# Patient Record
Sex: Male | Born: 1960 | Race: White | Hispanic: No | Marital: Single | Smoking: Current every day smoker
Health system: Southern US, Community
[De-identification: ages and names within clinical notes are randomized; demographics above are authoritative.]

## PROBLEM LIST (undated history)

## (undated) DIAGNOSIS — K219 Gastro-esophageal reflux disease without esophagitis: Secondary | ICD-10-CM

## (undated) DIAGNOSIS — T7840XA Allergy, unspecified, initial encounter: Secondary | ICD-10-CM

## (undated) HISTORY — DX: Gastro-esophageal reflux disease without esophagitis: K21.9

## (undated) HISTORY — PX: APPENDECTOMY: SHX54

## (undated) HISTORY — DX: Allergy, unspecified, initial encounter: T78.40XA

---

## 2013-12-04 ENCOUNTER — Encounter: Payer: Self-pay | Admitting: Family Medicine

## 2013-12-04 ENCOUNTER — Other Ambulatory Visit (INDEPENDENT_AMBULATORY_CARE_PROVIDER_SITE_OTHER): Payer: BC Managed Care – PPO

## 2013-12-04 ENCOUNTER — Ambulatory Visit (INDEPENDENT_AMBULATORY_CARE_PROVIDER_SITE_OTHER): Payer: BC Managed Care – PPO | Admitting: Family Medicine

## 2013-12-04 VITALS — BP 140/80 | HR 90 | Ht 71.0 in | Wt 138.0 lb

## 2013-12-04 DIAGNOSIS — R634 Abnormal weight loss: Secondary | ICD-10-CM

## 2013-12-04 DIAGNOSIS — M25512 Pain in left shoulder: Secondary | ICD-10-CM

## 2013-12-04 DIAGNOSIS — M25519 Pain in unspecified shoulder: Secondary | ICD-10-CM

## 2013-12-04 DIAGNOSIS — M7500 Adhesive capsulitis of unspecified shoulder: Secondary | ICD-10-CM | POA: Insufficient documentation

## 2013-12-04 MED ORDER — MELOXICAM 15 MG PO TABS: 15.0000 mg | ORAL_TABLET | Freq: Every day | ORAL | Status: AC

## 2013-12-04 NOTE — Progress Notes (Signed)
Travis ScaleZach Coe Angelos D.O. [facility] [address] [place], [place] [address] Phone: [phone]([phone] Subjective:     CC: Left shoulder pain  BJY:NWGNFAOZHYHPI:Subjective Travis Alvarez CornBruce R Alvarez is a 53 y.o. male coming in with complaint of left shoulder pain. Patient has had this pain for quite some time and states it seemed to start in the summer. Patient states he has been doing a lot activity and has a dull aching pain that seems to radiate down his arm after activity. Patient denies any weakness but he stopped certain activity secondary to pain. Patient states it is very hard to get comfortable at night. No injury.  Severity 8/10.       Past medical history, social, surgical and family history all reviewed in electronic medical record.   Review of Systems: No headache, visual changes, nausea, vomiting, diarrhea, constipation, dizziness, abdominal pain, skin rash, fevers, chills, night sweats, weight loss, swollen lymph nodes, body aches, joint swelling, muscle aches, chest pain, shortness of breath, mood changes.   Objective Blood pressure 140/80, pulse 90, weight 138 lb (62.596 kg), SpO2 96.00%.  General: No apparent distress alert and oriented x3 mood and affect normal, dressed appropriately. Frail.  HEENT: Pupils equal, extraocular movements intact  Respiratory: Patient's speak in full sentences and does not appear short of breath  Cardiovascular: No lower extremity edema, non tender, no erythema  Skin: Warm dry intact with no signs of infection or rash on extremities or on axial skeleton.  Abdomen: Soft nontender  Neuro: Cranial nerves II through XII are intact, neurovascularly intact in all extremities with 2+ DTRs and 2+ pulses.  Lymph: No lymphadenopathy of posterior or anterior cervical chain or axillae bilaterally.  Gait normal with good balance and coordination.  MSK:  Non tender with full range of motion and good stability and symmetric strength and tone of  elbows, wrist, hip, knee and ankles  bilaterally.  Shoulder:left  Inspection reveals no abnormalities, atrophy or asymmetry. Mild atrophy of the shoulder musculature bilaterally Palpation has generalized tenderness of the shoulder ROM is instructed in passive and active range of motion with forward flexion to 95, external rotation of 10 and internal rotation to lateral hip. Rotator cuff strength normal throughout. signs of impingement with positive Neer and Hawkin's tests, empty can sign. Speeds and Yergason's tests normal. No labral pathology noted with negative Obrien's, negative clunk and good stability. Normal scapular function observed. No painful arc and no drop arm sign. No apprehension sign Contralateral shoulder has significant more strength and movement  MSK US performed of: Left This study was ordered, performed, and interpreted by Terrilee FilesZach Myan Suit D.O.  Shoulder:   Supraspinatus:  Appears normal on long and transverse views, no bursal bulge seen with shoulder abduction on impingement view. A sugar does have thickening of the capsule surrounding the area. Infraspinatus:  Appears normal on long and transverse views. Subscapularis:  Appears normal on long and transverse views. Teres Minor:  Appears normal on long and transverse views. AC joint:  Moderate osteoarthritic changes Glenohumeral Joint:  Appears normal without effusion. Glenoid Labrum:  Intact without visualized tears. Biceps Tendon:  Appears normal on long and transverse views, no fraying of tendon, tendon located in intertubercular groove, no subluxation with shoulder internal or external rotation. No increased power doppler signal. Impression:  frozen shoulder  Procedure: Real-time Ultrasound Guided Injection of left glenohumeral joint Device: GE Logiq E  Ultrasound guided injection is preferred based studies that show increased duration, increased effect, greater accuracy, decreased procedural pain,  increased response rate with ultrasound guided versus  blind injection.  Verbal informed consent obtained.  Time-out conducted.  Noted no overlying erythema, induration, or other signs of local infection.  Skin prepped in a sterile fashion.  Local anesthesia: Topical Ethyl chloride.  With sterile technique and under real time ultrasound guidance:  Joint visualized.  23g 1  inch needle inserted posterior approach. Pictures taken for needle placement. Patient did have injection of 2 cc of 1% lidocaine, 2 cc of 0.5% Marcaine, and 1cc of Kenalog 40 mg/dL. Completed without difficulty  Pain immediately resolved suggesting accurate placement of the medication.  Advised to call if fevers/chills, erythema, induration, drainage, or persistent bleeding.  Images permanently stored and available for review in the ultrasound unit.  Impression: Technically successful ultrasound guided injection.    Impression and Recommendations:     This case required medical decision making of moderate complexity.

## 2013-12-04 NOTE — Assessment & Plan Note (Signed)
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 Patient states that he does not have any diabetes has lost weight recently. Patient is having a workup by primary care provider at this time for his weight loss. Patient did respond extremely well to the intra-articular injection today with some increasing range of motion. Patient had significant decrease in pain immediately. Patient given home exercises to do on a daily basis as well as an icing protocol and meloxicam daily for 10 days then as needed. Patient and will come back again in 3 weeks for further evaluation. He continues to have trouble we may want to consider formal physical therapy.

## 2013-12-04 NOTE — Patient Instructions (Addendum)
Nice to meet you Try exercises 3 times a week Meloxicam daily for 10 days then as needed Ice 20 minutes 2 times a day mostly after exercises and before bed.  Come back in 3 weeks if not better.

## 2013-12-04 NOTE — Assessment & Plan Note (Signed)
This patient been a smoker we discussed the possibility of chest x-ray the patient states that he is being worked up by primary care provider. We'll allow him to continue with the workup.

## 2013-12-27 ENCOUNTER — Ambulatory Visit (INDEPENDENT_AMBULATORY_CARE_PROVIDER_SITE_OTHER): Payer: BC Managed Care – PPO | Admitting: Family Medicine

## 2013-12-27 VITALS — BP 118/70 | HR 78 | Wt 135.0 lb

## 2013-12-27 DIAGNOSIS — M7500 Adhesive capsulitis of unspecified shoulder: Secondary | ICD-10-CM

## 2013-12-27 MED ORDER — DICLOFENAC SODIUM 1 % TD GEL: 2.0000 g | Freq: Two times a day (BID) | TRANSDERMAL | Status: AC

## 2013-12-27 NOTE — Patient Instructions (Addendum)
Very good to see you doing better.  Ice still 10 minutes 2 times a day.  voltaren gel twice daily.  Call me if you want to continue the meloxicam  Continue exercises most days of the week at least including new pain.  Come back in 3-4 weeks again to check and we can consider another reinjection.

## 2013-12-28 ENCOUNTER — Encounter: Payer: Self-pay | Admitting: Family Medicine

## 2013-12-28 NOTE — Progress Notes (Signed)
  Travis ScaleZach Linsy Alvarez D.O. [facility] [address] [place], [place] [address] Phone: [phone](336) [phone] Subjective:     CC: Left shoulder pain follow up  BJY:NWGNFAOZHYHPI:Subjective Travis CornBruce R Alvarez is a 53 y.o. male coming in with complaint of left shoulder pain. Patient was diagnosed with adhesive capsulitis. Patient did have injection notice mild to moderate benefit. Patient states that he continues to do the exercises on her in the basis and has noted some mild increase in range of motion but continues to have the same amount of pain. Patient though is trying to not take the pain medicines on a regular basis. Patient denies any new symptoms. Denies any waking him up at night.      Past medical history, social, surgical and family history all reviewed in electronic medical record.   Review of Systems: No headache, visual changes, nausea, vomiting, diarrhea, constipation, dizziness, abdominal pain, skin rash, fevers, chills, night sweats, weight loss, swollen lymph nodes, body aches, joint swelling, muscle aches, chest pain, shortness of breath, mood changes.   Objective Blood pressure 118/70, pulse 78, weight 135 lb (61.236 kg), SpO2 97.00%.  General: No apparent distress alert and oriented x3 mood and affect normal, dressed appropriately. Frail.  HEENT: Pupils equal, extraocular movements intact  Respiratory: Patient's speak in full sentences and does not appear short of breath  Cardiovascular: No lower extremity edema, non tender, no erythema  Skin: Warm dry intact with no signs of infection or rash on extremities or on axial skeleton.  Abdomen: Soft nontender  Neuro: Cranial nerves II through XII are intact, neurovascularly intact in all extremities with 2+ DTRs and 2+ pulses.  Lymph: No lymphadenopathy of posterior or anterior cervical chain or axillae bilaterally.  Gait normal with good balance and coordination.  MSK:  Non tender with full range of motion and good stability and symmetric strength  and tone of  elbows, wrist, hip, knee and ankles bilaterally.  Shoulder:left  Inspection reveals no abnormalities, atrophy or asymmetry. Mild atrophy of the shoulder musculature bilaterally Palpation has generalized tenderness of the shoulder ROM is decreased in passive and active range of motion with forward flexion to 135, external rotation of 15 and internal rotation to lateral hip. This is though significantly improved from prior exam Rotator cuff strength normal throughout. signs of impingement with positive Neer and Hawkin's tests, empty can sign. Speeds and Yergason's tests normal. No labral pathology noted with negative Obrien's, negative clunk and good stability. Normal scapular function observed. No painful arc and no drop arm sign. No apprehension sign Contralateral shoulder has significant more strength and movement      Impression and Recommendations:     This case required medical decision making of moderate complexity.  Spent greater than 25 minutes with patient face-to-face and had greater than 50% of counseling including as described above in assessment and plan.

## 2013-12-28 NOTE — Assessment & Plan Note (Signed)
Discuss again with patient at great length. We discussed different treatment options including another steroid injection or formal physical therapy. Patient declines both of these interventions. We also discussed continuing home exercises as well as medications. Patient will continue with the exercises and was given other ones to try to do and oriented basis. Patient to declined any other prescription medications at this time. Patient will take meloxicam on an as-needed basis he states. We discussed icing protocol. Patient will continue with this conservative treatment and come back again in 3 weeks for further evaluation. At followup we can consider another ultrasound an injection.

## 2014-01-24 ENCOUNTER — Encounter: Payer: Self-pay | Admitting: Family Medicine

## 2014-01-24 ENCOUNTER — Ambulatory Visit (INDEPENDENT_AMBULATORY_CARE_PROVIDER_SITE_OTHER)
Admission: RE | Admit: 2014-01-24 | Discharge: 2014-01-24 | Disposition: A | Discharge status: Home / Self Care | Payer: BC Managed Care – PPO | Source: Ambulatory Visit | Attending: Family Medicine | Admitting: Family Medicine

## 2014-01-24 ENCOUNTER — Ambulatory Visit (INDEPENDENT_AMBULATORY_CARE_PROVIDER_SITE_OTHER): Payer: BC Managed Care – PPO | Admitting: Family Medicine

## 2014-01-24 ENCOUNTER — Other Ambulatory Visit (INDEPENDENT_AMBULATORY_CARE_PROVIDER_SITE_OTHER): Payer: BC Managed Care – PPO

## 2014-01-24 VITALS — BP 122/64 | HR 87 | Ht 71.5 in | Wt 131.0 lb

## 2014-01-24 DIAGNOSIS — M7500 Adhesive capsulitis of unspecified shoulder: Secondary | ICD-10-CM

## 2014-01-24 DIAGNOSIS — R634 Abnormal weight loss: Secondary | ICD-10-CM

## 2014-01-24 NOTE — Progress Notes (Signed)
Travis ScaleZach Lucely Alvarez D.O. [facility] [address] [place], [place] [address] Phone: [phone](336) [phone] Subjective:     CC: Left shoulder pain follow up  BJY:NWGNFAOZHYHPI:Subjective Travis Alvarez is a 53 y.o. male coming in with complaint of left shoulder pain. Patient was diagnosed with adhesive capsulitis. Patient underwent injection greater than 2 months ago. Patient states since that time he continues to improve slowly. Patient states that the pain is much better but still has limited range of motion. Patient states it is a seconding that he does a regular basis. Patient would like to have full range of motion. Patient continues to have weight loss to that he is more concerned about. Patient does not think that they are associated. Patient is not having much of an appetite.      Past medical history, social, surgical and family history all reviewed in electronic medical record.   Review of Systems: No headache, visual changes, nausea, vomiting, diarrhea, constipation, dizziness, abdominal pain, skin rash, fevers, chills, night sweats, weight loss, swollen lymph nodes, body aches, joint swelling, muscle aches, chest pain, shortness of breath, mood changes.   Objective Blood pressure 122/64, pulse 87, height 5' 11.5" (1.816 m), weight 131 lb (59.421 kg), SpO2 95.00%.  General: No apparent distress alert and oriented x3 mood and affect normal, dressed appropriately. Frail.  HEENT: Pupils equal, extraocular movements intact  Respiratory: Patient's speak in full sentences and does not appear short of breath  Cardiovascular: No lower extremity edema, non tender, no erythema  Skin: Warm dry intact with no signs of infection or rash on extremities or on axial skeleton.  Abdomen: Soft nontender  Neuro: Cranial nerves II through XII are intact, neurovascularly intact in all extremities with 2+ DTRs and 2+ pulses.  Lymph: No lymphadenopathy of posterior or anterior cervical chain or axillae bilaterally.  Gait  normal with good balance and coordination.  MSK:  Non tender with full range of motion and good stability and symmetric strength and tone of  elbows, wrist, hip, knee and ankles bilaterally.  Shoulder:left  Inspection reveals no abnormalities, atrophy or asymmetry. Mild atrophy of the shoulder musculature bilaterally Palpation has generalized tenderness of the shoulder ROM is decreased in passive and active range of motion with forward flexion to 155, external rotation of 15 and internal rotation to the sacrum.  continued improvement from previous exam Rotator cuff strength normal throughout. signs of impingement with positive Neer and Hawkin's tests, empty can sign. Speeds and Yergason's tests normal. No labral pathology noted with negative Obrien's, negative clunk and good stability. Normal scapular function observed. No painful arc and no drop arm sign. No apprehension sign Contralateral shoulder has significant more strength and movement  Procedure: Real-time Ultrasound Guided Injection of left glenohumeral joint Device: GE Logiq E  Ultrasound guided injection is preferred based studies that show increased duration, increased effect, greater accuracy, decreased procedural pain, increased response rate with ultrasound guided versus blind injection.  Verbal informed consent obtained.  Time-out conducted.  Noted no overlying erythema, induration, or other signs of local infection.  Skin prepped in a sterile fashion.  Local anesthesia: Topical Ethyl chloride.  With sterile technique and under real time ultrasound guidance:  Joint visualized.  23g 1  inch needle inserted posterior approach. Pictures taken for needle placement. Patient did have injection of 2 cc of 1% lidocaine, 2 cc of 0.5% Marcaine, and 1cc of Kenalog 40 mg/dL. Completed without difficulty  Pain immediately resolved suggesting accurate placement of the medication.  Advised to call if fevers/chills, erythema, induration,  drainage, or persistent bleeding.  Images permanently stored and available for review in the ultrasound unit.  Impression: Technically successful ultrasound guided injection.     Impression and Recommendations:     This case required medical decision making of moderate complexity.  Spent greater than 25 minutes with patient face-to-face and had greater than 50% of counseling including as described above in assessment and plan.

## 2014-01-24 NOTE — Assessment & Plan Note (Signed)
Patient has lost 70 pounds over the course last 2 months. I do feel that an x-ray was necessary because he is a smoker. Patient's x-ray is unremarkable and reviewed by me today. Patient is going to be seen a new primary care provider soon. Patient likely needs a thorough workup.

## 2014-01-24 NOTE — Assessment & Plan Note (Signed)
Patient was given another injection today. I am hoping that this resolves mostly the adhesions and patient did continue with his range of motion. Patient will come back again in 4 weeks for further evaluation.  Spent greater than 25 minutes with patient face-to-face and had greater than 50% of counseling including as described above in assessment and plan.

## 2014-01-24 NOTE — Patient Instructions (Signed)
Capsaicin.  You can get this over the counter.  Put on the shoulder 3 times daily.  We did injection again today Keep doing the exercises you are doing great!!!! Get xray of chest today.  Boost or ensure 45 minutes before meal.  Come back in 4 weeks.  Dr. Drue NovelPaz may be able to see you and discuss your weight.

## 2014-02-21 ENCOUNTER — Encounter: Payer: Self-pay | Admitting: Family Medicine

## 2014-02-21 ENCOUNTER — Ambulatory Visit (INDEPENDENT_AMBULATORY_CARE_PROVIDER_SITE_OTHER): Payer: BC Managed Care – PPO | Admitting: Family Medicine

## 2014-02-21 VITALS — BP 110/62 | HR 94 | Ht 71.0 in | Wt 128.0 lb

## 2014-02-21 DIAGNOSIS — M7500 Adhesive capsulitis of unspecified shoulder: Secondary | ICD-10-CM

## 2014-02-21 DIAGNOSIS — F32A Depression, unspecified: Secondary | ICD-10-CM

## 2014-02-21 DIAGNOSIS — F329 Major depressive disorder, single episode, unspecified: Secondary | ICD-10-CM | POA: Insufficient documentation

## 2014-02-21 DIAGNOSIS — F3289 Other specified depressive episodes: Secondary | ICD-10-CM

## 2014-02-21 DIAGNOSIS — R634 Abnormal weight loss: Secondary | ICD-10-CM

## 2014-02-21 NOTE — Progress Notes (Signed)
  Tawana ScaleZach Taelar Gronewold D.O. [facility] [address] [place], [place] [address] Phone: [phone](336) [phone] Subjective:     CC: Left shoulder pain follow up  OZH:YQMVHQIONGHPI:Subjective Sunday CornBruce R Alvarez is a 53 y.o. male coming in with complaint of left shoulder pain. Patient was diagnosed with adhesive capsulitis. Patient over the course last 3 months has had 2 intra-articular injections into the shoulder. Patient continue to do home exercises on a regular basis. Patient states overall he is happier.  Patient also states that he continues to lose weight. Patient has been hungry but states that sometimes he has to vomit so he can eat more. Patient also has tangential thought discussing that he is also has people who are following him around from time to time but he has not seen him. Patient states though. Patient denies any suicidal or homicidal ideation and denies any voices in his head. Patient states though because he is anxious because of this as well as because he does not have much money he gets concern.       Past medical history, social, surgical and family history all reviewed in electronic medical record.   Review of Systems: No headache, visual changes, nausea, vomiting, diarrhea, constipation, dizziness, abdominal pain, skin rash, fevers, chills, night sweats, weight loss, swollen lymph nodes, body aches, joint swelling, muscle aches, chest pain, shortness of breath, mood changes.   Objective Blood pressure 110/62, pulse 94, height 5\' 11"  (1.803 m), weight 128 lb (58.06 kg), SpO2 94.00%.  General: Patient does appear to continue to lose weight. Patient is also having a flat affect. HEENT: Pupils equal, extraocular movements intact  Respiratory: Patient's speak in full sentences and does not appear short of breath  Cardiovascular: No lower extremity edema, non tender, no erythema  Skin: Warm dry intact with no signs of infection or rash on extremities or on axial skeleton.  Abdomen: Soft nontender    Neuro: Cranial nerves II through XII are intact, neurovascularly intact in all extremities with 2+ DTRs and 2+ pulses.  Lymph: No lymphadenopathy of posterior or anterior cervical chain or axillae bilaterally.  Gait normal with good balance and coordination.  MSK:  Non tender with full range of motion and good stability and symmetric strength and tone of  elbows, wrist, hip, knee and ankles bilaterally.  Shoulder:left  Inspection reveals no abnormalities, atrophy or asymmetry. Mild atrophy of the shoulder musculature bilaterally Palpation has generalized tenderness of the shoulder ROM is decreased in passive and active range of motion with forward flexion to 175, external rotation of 18 and internal rotation to the sacrum.  continued improvement from previous exam Rotator cuff strength normal throughout. signs of impingement with positive Neer and Hawkin's tests, empty can sign. Speeds and Yergason's tests normal. No labral pathology noted with negative Obrien's, negative clunk and good stability. Normal scapular function observed. No painful arc and no drop arm sign. No apprehension sign Contralateral shoulder has significant more strength and movement    Impression and Recommendations:     This case required medical decision making of moderate complexity.  Spent greater than 25 minutes with patient face-to-face and had greater than 50% of counseling including as described above in assessment and plan.

## 2014-02-21 NOTE — Assessment & Plan Note (Signed)
Patient shoulder has improved with range of motion and significant decrease in pain. Patient encouraged to continue the exercises on a regular basis. Patient has a near full range of motion. Patient encouraged to continue to do this and I will come back again in 6 weeks for hopefully one more followup.

## 2014-02-21 NOTE — Assessment & Plan Note (Signed)
Patient did have a CBG done today that showed a number of 101 We discussed about getting more labs today but patient states that he did establish care with a primary care provider outside of our computer system. Patient states though he is not happy with this individual. We discussed getting release of information in getting the labs. I would like to make sure that patient has had a CBC and a PSA done recently. We also or attempting to get patient establish with a new primary care provider Dr. Durene CalHunter in August.

## 2014-02-21 NOTE — Assessment & Plan Note (Addendum)
Patient does have what appears to be some depression. We did not do a screening tool at this time. Patient denies any suicidal or homicidal ideation. Patient is to have racing thoughts and states that sometimes he is feeling paranoid. Patient does not have a job and has some financial constraints which is difficult as well. We discussed referral to [facility] health for further evaluation which patient declined. Discuss with patient though at great length that if patient's thoughts of her change to hurting him or someone else that he needs to seek medical attention and medially and I am always available. Spent greater than 25 minutes with patient face-to-face and had greater than 50% of counseling including as described above in assessment and plan.

## 2014-02-21 NOTE — Patient Instructions (Signed)
It is good to see you Your shoulder is getting better Continue the exercises I really want you to get a new doctor.  Try to get the other information if you want me to look it over I am here if you need anything.  If your shoulder is not perfect come back in 6 weeks.

## 2014-04-11 ENCOUNTER — Other Ambulatory Visit: Payer: Self-pay | Admitting: Family Medicine

## 2014-04-11 DIAGNOSIS — R1310 Dysphagia, unspecified: Secondary | ICD-10-CM

## 2014-04-11 DIAGNOSIS — R634 Abnormal weight loss: Secondary | ICD-10-CM

## 2014-04-16 ENCOUNTER — Other Ambulatory Visit: Payer: BC Managed Care – PPO

## 2014-04-18 ENCOUNTER — Other Ambulatory Visit: Payer: Self-pay | Admitting: Family Medicine

## 2014-04-18 ENCOUNTER — Ambulatory Visit
Admission: RE | Admit: 2014-04-18 | Discharge: 2014-04-18 | Disposition: A | Discharge status: Home / Self Care | Payer: BC Managed Care – PPO | Source: Ambulatory Visit | Attending: Family Medicine | Admitting: Family Medicine

## 2014-04-18 DIAGNOSIS — R634 Abnormal weight loss: Secondary | ICD-10-CM

## 2014-04-18 DIAGNOSIS — R1310 Dysphagia, unspecified: Secondary | ICD-10-CM

## 2015-04-15 IMAGING — CR DG CHEST 2V
2 series · 2 of 2 positions shown · non-contrast
Comparison: Chest x-ray of 01/24/2014

CLINICAL DATA: Cough, smoking history

EXAM:
CHEST  2 VIEW

[w chest pa]
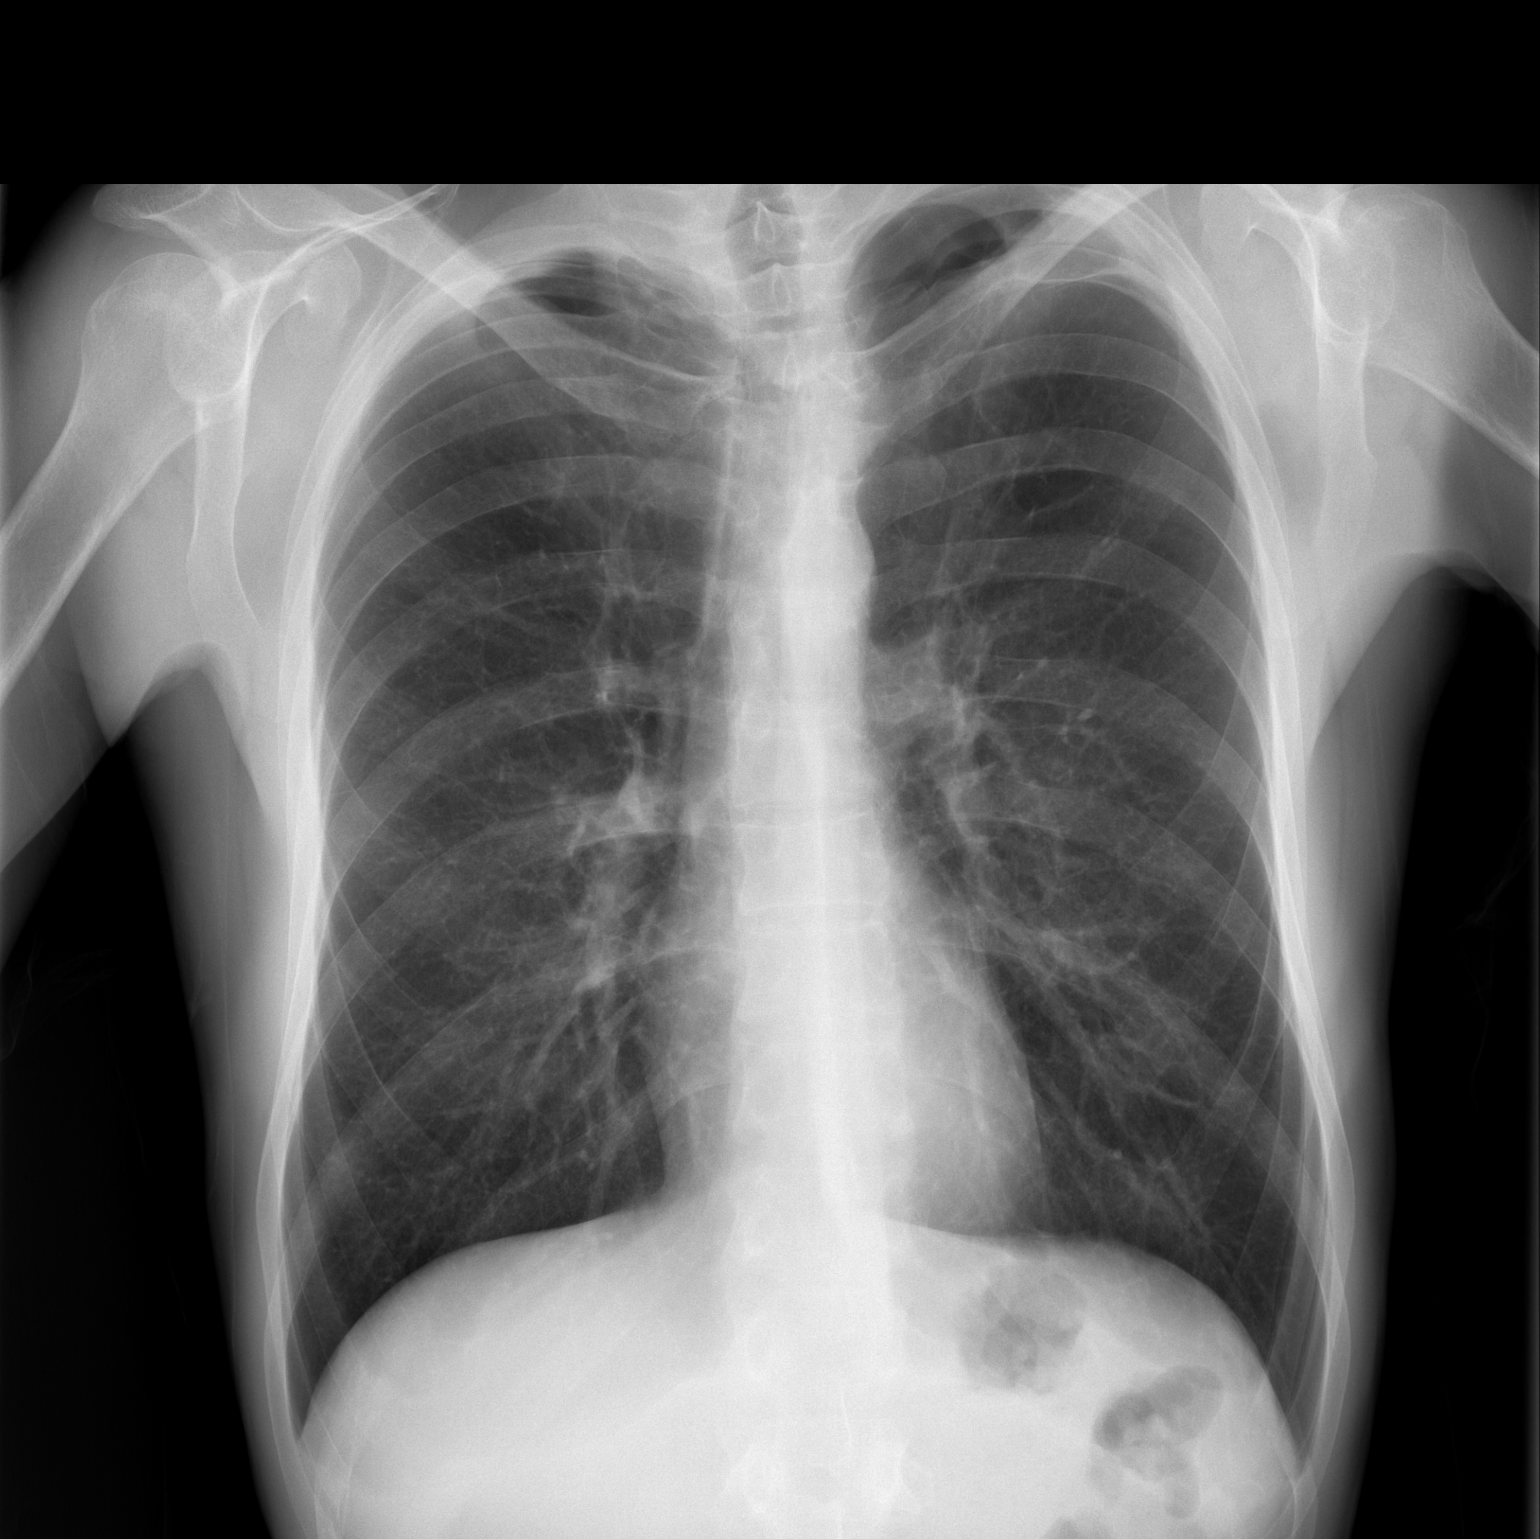

[w chest lat]
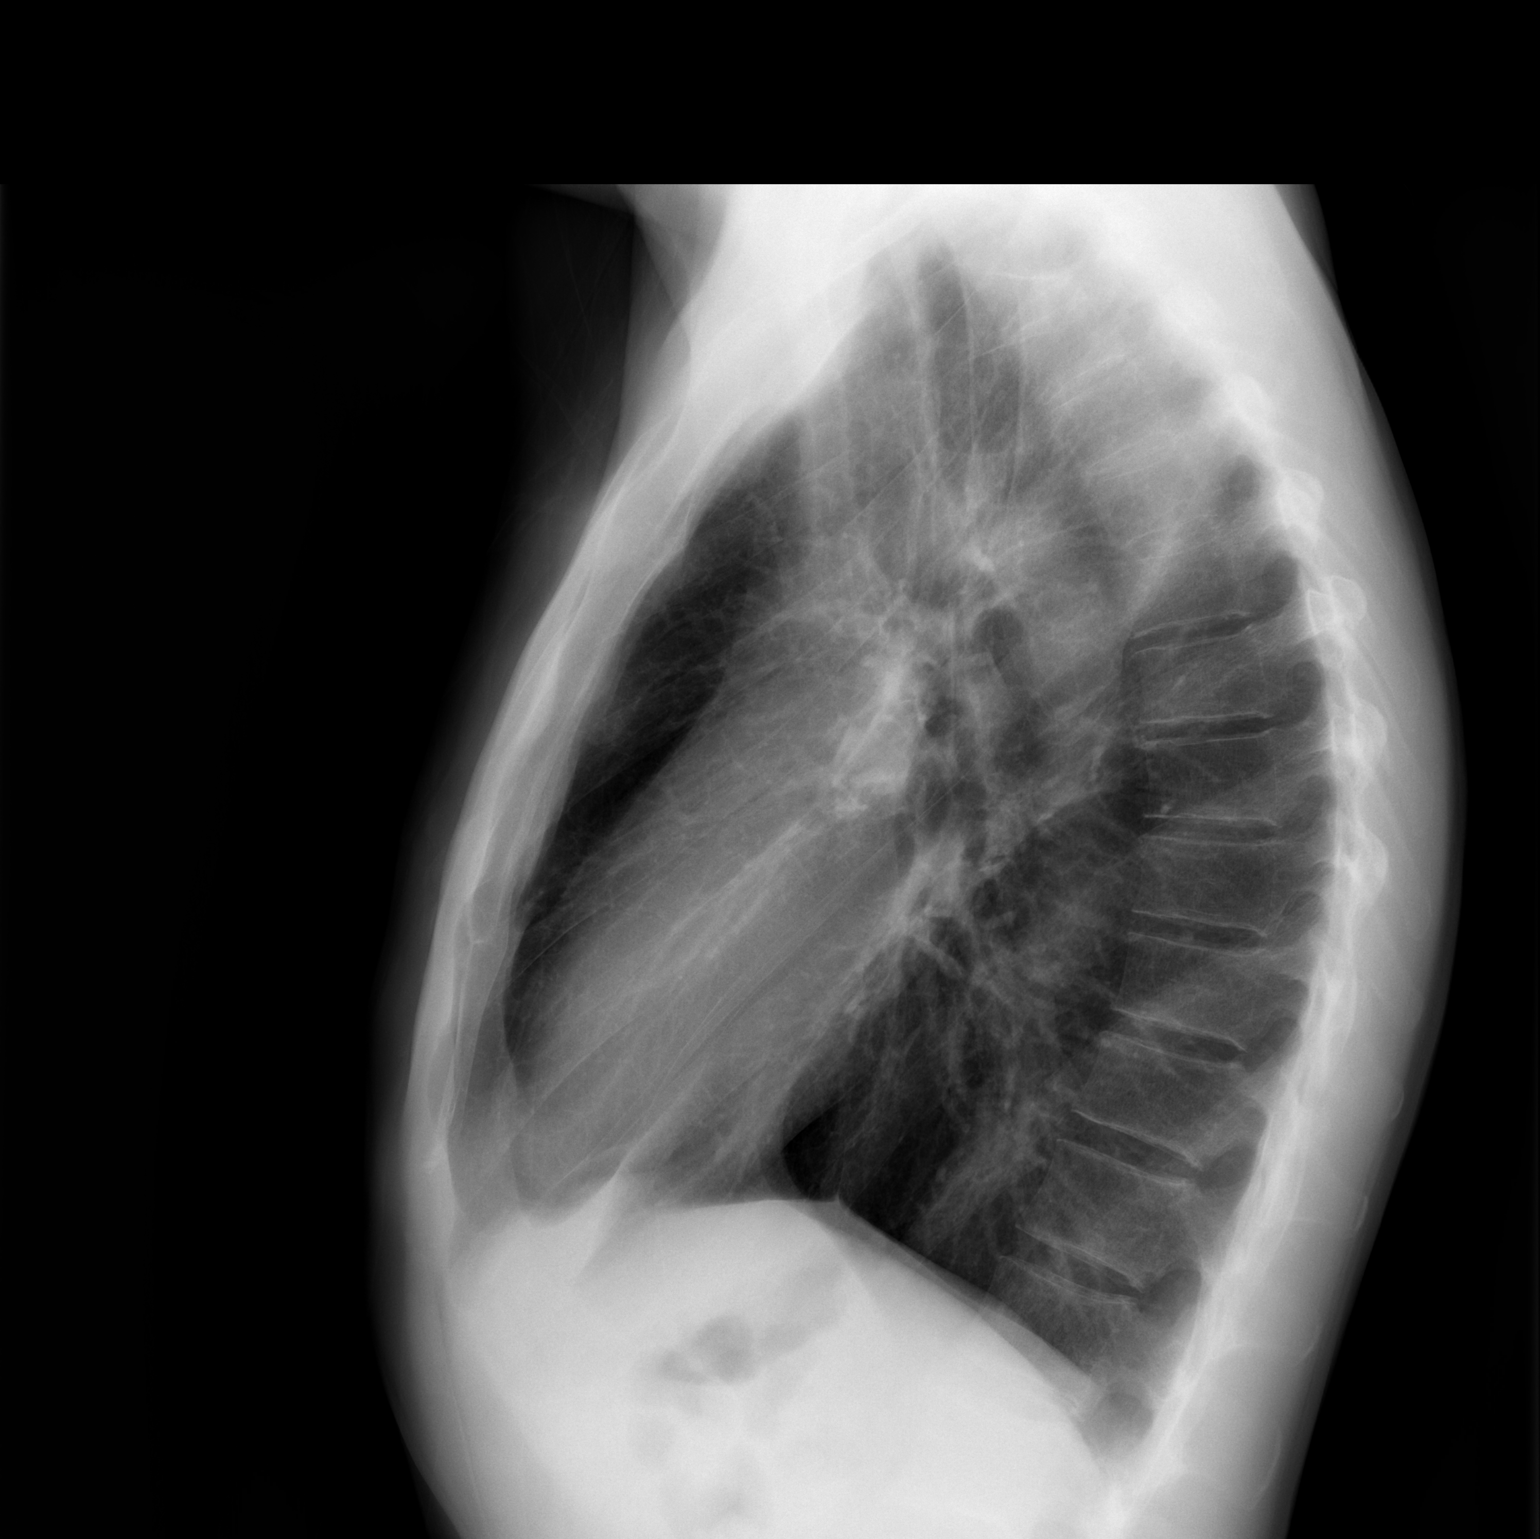

[2 of 2 positions shown; findings below may reference images not displayed]

FINDINGS: The lungs are clear but hyperaerated. Mild bullous formation in the
apices is again noted. Mediastinal and hilar contours are unchanged.
The heart is within normal limits in size. No bony abnormality is
seen.
IMPRESSION: No active cardiopulmonary disease. No change in slight hyper
aeration

## 2019-11-30 ENCOUNTER — Ambulatory Visit: Payer: 59

## 2019-12-04 ENCOUNTER — Ambulatory Visit: Payer: Self-pay | Attending: Internal Medicine

## 2019-12-04 DIAGNOSIS — Z23 Encounter for immunization: Secondary | ICD-10-CM

## 2019-12-04 NOTE — Progress Notes (Signed)
   Covid-19 Vaccination Clinic  Name:  Travis Alvarez    MRN: [id number] DOB: Oct 10, 1960  12/04/2019  [name] was observed post Covid-19 immunization for 15 minutes without incident. He was provided with Vaccine Information Sheet and instruction to access the V-Safe system.   [name] was instructed to call 911 with any severe reactions post vaccine: Marland Kitchen Difficulty breathing  . Swelling of face and throat  . A fast heartbeat  . A bad rash all over body  . Dizziness and weakness   Immunizations Administered    Name Date Dose VIS Date Route   Pfizer COVID-19 Vaccine 12/04/2019  9:22 AM 0.3 mL 08/18/2019 Intramuscular   Manufacturer: [employer], [employer]   Lot: [id number]   NDC: [address]-0

## 2019-12-27 ENCOUNTER — Ambulatory Visit: Payer: Self-pay | Attending: Internal Medicine

## 2019-12-27 DIAGNOSIS — Z23 Encounter for immunization: Secondary | ICD-10-CM

## 2019-12-27 NOTE — Progress Notes (Signed)
   Covid-19 Vaccination Clinic  Name:  Travis Alvarez    MRN: [id number] DOB: [birth year]  12/27/2019  [name] was observed post Covid-19 immunization for 15 minutes without incident. He was provided with Vaccine Information Sheet and instruction to access the V-Safe system.   [name] was instructed to call 911 with any severe reactions post vaccine: Marland Kitchen Difficulty breathing  . Swelling of face and throat  . A fast heartbeat  . A bad rash all over body  . Dizziness and weakness   Immunizations Administered    Name Date Dose VIS Date Route   Pfizer COVID-19 Vaccine 12/27/2019  9:00 AM 0.3 mL 11/01/2018 Intramuscular   Manufacturer: [employer], [employer]   Lot: [id number]   NDC: [address]-2

## 2020-07-06 ENCOUNTER — Ambulatory Visit: Payer: Self-pay | Attending: Internal Medicine

## 2020-07-06 ENCOUNTER — Other Ambulatory Visit: Payer: Self-pay

## 2020-07-06 DIAGNOSIS — Z23 Encounter for immunization: Secondary | ICD-10-CM

## 2020-07-06 NOTE — Progress Notes (Signed)
   Covid-19 Vaccination Clinic  Name:  Travis Alvarez    MRN: [id number] DOB: 13-Aug-1961  07/06/2020  Mr. [name] was observed post Covid-19 immunization for 15 minutes without incident. He was provided with Vaccine Information Sheet and instruction to access the V-Safe system.   Mr. [name] was instructed to call 911 with any severe reactions post vaccine: Marland Kitchen Difficulty breathing  . Swelling of face and throat  . A fast heartbeat  . A bad rash all over body  . Dizziness and weakness

## 2021-02-07 ENCOUNTER — Ambulatory Visit: Payer: Self-pay

## 2021-02-14 ENCOUNTER — Other Ambulatory Visit (HOSPITAL_BASED_OUTPATIENT_CLINIC_OR_DEPARTMENT_OTHER): Payer: Self-pay

## 2021-02-14 ENCOUNTER — Other Ambulatory Visit: Payer: Self-pay

## 2021-02-14 ENCOUNTER — Ambulatory Visit: Payer: Self-pay | Attending: Internal Medicine

## 2021-02-14 DIAGNOSIS — Z23 Encounter for immunization: Secondary | ICD-10-CM

## 2021-02-14 MED ORDER — PFIZER-BIONT COVID-19 VAC-TRIS 30 MCG/0.3ML IM SUSP
INTRAMUSCULAR | 0 refills | Status: AC
  Filled 2021-02-14: qty 0.3, 1d supply, fill #0

## 2021-02-14 NOTE — Progress Notes (Signed)
   Covid-19 Vaccination Clinic  Name:  Travis Alvarez    MRN: [id number] DOB: May 10, 1961  02/14/2021  Travis Alvarez was observed post Covid-19 immunization for 15 minutes without incident. He was provided with Vaccine Information Sheet and instruction to access the V-Safe system.   Travis Alvarez was instructed to call 911 with any severe reactions post vaccine: Difficulty breathing  Swelling of face and throat  A fast heartbeat  A bad rash all over body  Dizziness and weakness   Immunizations Administered     Name Date Dose VIS Date Route   PFIZER Comrnaty(Gray TOP) Covid-19 Vaccine 02/14/2021 10:38 AM 0.3 mL 08/15/2020 Intramuscular   Manufacturer: [employer], [employer]   Lot: [id number]   NDC: [phone]

## 2021-07-18 ENCOUNTER — Ambulatory Visit: Payer: Self-pay | Attending: Internal Medicine

## 2021-07-18 ENCOUNTER — Other Ambulatory Visit (HOSPITAL_BASED_OUTPATIENT_CLINIC_OR_DEPARTMENT_OTHER): Payer: Self-pay

## 2021-07-18 DIAGNOSIS — Z23 Encounter for immunization: Secondary | ICD-10-CM

## 2021-07-18 MED ORDER — PFIZER COVID-19 VAC BIVALENT 30 MCG/0.3ML IM SUSP
INTRAMUSCULAR | 0 refills | Status: AC
  Filled 2021-07-18: qty 0.3, 1d supply, fill #0

## 2021-07-18 NOTE — Progress Notes (Signed)
   Covid-19 Vaccination Clinic  Name:  ROGUE RAFALSKI    MRN: [id number] DOB: [birth year]  07/18/2021  Mr. Tse was observed post Covid-19 immunization for 15 minutes without incident. He was provided with Vaccine Information Sheet and instruction to access the V-Safe system.   Mr. Witucki was instructed to call 911 with any severe reactions post vaccine: Difficulty breathing  Swelling of face and throat  A fast heartbeat  A bad rash all over body  Dizziness and weakness   Immunizations Administered     Name Date Dose VIS Date Route   Pfizer Covid-19 Vaccine Bivalent Booster 07/18/2021 10:05 AM 0.3 mL 05/07/2021 Intramuscular   Manufacturer: [employer], [employer]   Lot: [id number]   NDC: [phone]

## 2022-01-14 ENCOUNTER — Other Ambulatory Visit (HOSPITAL_BASED_OUTPATIENT_CLINIC_OR_DEPARTMENT_OTHER): Payer: Self-pay

## 2022-06-26 ENCOUNTER — Other Ambulatory Visit (HOSPITAL_BASED_OUTPATIENT_CLINIC_OR_DEPARTMENT_OTHER): Payer: Self-pay

## 2022-06-26 MED ORDER — COMIRNATY 30 MCG/0.3ML IM SUSY
PREFILLED_SYRINGE | INTRAMUSCULAR | 0 refills | Status: AC
  Filled 2022-06-26: qty 0.3, 1d supply, fill #0

## 2023-06-11 ENCOUNTER — Other Ambulatory Visit (HOSPITAL_BASED_OUTPATIENT_CLINIC_OR_DEPARTMENT_OTHER): Payer: Self-pay

## 2023-06-11 MED ORDER — COVID-19 MRNA VAC-TRIS(PFIZER) 30 MCG/0.3ML IM SUSY
0.3000 mL | PREFILLED_SYRINGE | Freq: Once | INTRAMUSCULAR | 0 refills | Status: AC
  Filled 2023-06-11: qty 0.3, 1d supply, fill #0
